# Patient Record
Sex: Female | Born: 1960 | Race: White | Hispanic: No | Marital: Married | Smoking: Former smoker
Health system: Southern US, Community
[De-identification: ages and names within clinical notes are randomized; demographics above are authoritative.]

## PROBLEM LIST (undated history)

## (undated) DIAGNOSIS — C50919 Malignant neoplasm of unspecified site of unspecified female breast: Secondary | ICD-10-CM

## (undated) DIAGNOSIS — N951 Menopausal and female climacteric states: Secondary | ICD-10-CM

## (undated) DIAGNOSIS — E079 Disorder of thyroid, unspecified: Secondary | ICD-10-CM

## (undated) HISTORY — PX: BREAST LUMPECTOMY: SHX2

## (undated) HISTORY — PX: TMJ ARTHROPLASTY: SHX1066

## (undated) HISTORY — PX: ENDOMETRIAL ABLATION: SHX621

## (undated) HISTORY — PX: TONSILLECTOMY: SUR1361

---

## 1997-06-30 ENCOUNTER — Ambulatory Visit (HOSPITAL_COMMUNITY): Admission: RE | Admit: 1997-06-30 | Discharge: 1997-06-30 | Payer: Self-pay | Admitting: *Deleted

## 1997-07-06 ENCOUNTER — Inpatient Hospital Stay (HOSPITAL_COMMUNITY): Admission: AD | Admit: 1997-07-06 | Discharge: 1997-07-06 | Payer: Self-pay | Admitting: Obstetrics and Gynecology

## 1997-10-24 ENCOUNTER — Inpatient Hospital Stay (HOSPITAL_COMMUNITY): Admission: AD | Admit: 1997-10-24 | Discharge: 1997-10-26 | Payer: Self-pay | Admitting: *Deleted

## 1999-08-19 ENCOUNTER — Encounter: Admission: RE | Admit: 1999-08-19 | Discharge: 1999-08-19 | Payer: Self-pay | Admitting: Family Medicine

## 1999-08-19 ENCOUNTER — Encounter: Payer: Self-pay | Admitting: Family Medicine

## 2000-07-26 ENCOUNTER — Encounter: Payer: Self-pay | Admitting: Family Medicine

## 2000-07-26 ENCOUNTER — Encounter: Admission: RE | Admit: 2000-07-26 | Discharge: 2000-07-26 | Payer: Self-pay | Admitting: Family Medicine

## 2000-07-31 ENCOUNTER — Encounter: Payer: Self-pay | Admitting: Family Medicine

## 2000-07-31 ENCOUNTER — Ambulatory Visit (HOSPITAL_COMMUNITY): Admission: RE | Admit: 2000-07-31 | Discharge: 2000-07-31 | Payer: Self-pay | Admitting: Family Medicine

## 2001-12-28 ENCOUNTER — Other Ambulatory Visit: Admission: RE | Admit: 2001-12-28 | Discharge: 2001-12-28 | Payer: Self-pay | Admitting: *Deleted

## 2003-03-20 ENCOUNTER — Other Ambulatory Visit: Admission: RE | Admit: 2003-03-20 | Discharge: 2003-03-20 | Payer: Self-pay | Admitting: *Deleted

## 2004-03-25 ENCOUNTER — Other Ambulatory Visit: Admission: RE | Admit: 2004-03-25 | Discharge: 2004-03-25 | Payer: Self-pay | Admitting: Obstetrics and Gynecology

## 2004-05-20 ENCOUNTER — Ambulatory Visit (HOSPITAL_COMMUNITY): Admission: RE | Admit: 2004-05-20 | Discharge: 2004-05-20 | Payer: Self-pay | Admitting: Obstetrics and Gynecology

## 2004-07-08 ENCOUNTER — Other Ambulatory Visit: Admission: RE | Admit: 2004-07-08 | Discharge: 2004-07-08 | Payer: Self-pay | Admitting: Obstetrics and Gynecology

## 2005-04-27 ENCOUNTER — Other Ambulatory Visit: Admission: RE | Admit: 2005-04-27 | Discharge: 2005-04-27 | Payer: Self-pay | Admitting: Obstetrics and Gynecology

## 2010-09-07 ENCOUNTER — Other Ambulatory Visit: Payer: Self-pay | Admitting: Obstetrics and Gynecology

## 2010-09-07 DIAGNOSIS — R928 Other abnormal and inconclusive findings on diagnostic imaging of breast: Secondary | ICD-10-CM

## 2010-09-24 ENCOUNTER — Ambulatory Visit
Admission: RE | Admit: 2010-09-24 | Discharge: 2010-09-24 | Disposition: A | Discharge status: Home / Self Care | Payer: 59 | Source: Ambulatory Visit | Attending: Obstetrics and Gynecology | Admitting: Obstetrics and Gynecology

## 2010-09-24 ENCOUNTER — Other Ambulatory Visit: Payer: Self-pay | Admitting: Obstetrics and Gynecology

## 2010-09-24 DIAGNOSIS — R928 Other abnormal and inconclusive findings on diagnostic imaging of breast: Secondary | ICD-10-CM

## 2010-09-24 DIAGNOSIS — R921 Mammographic calcification found on diagnostic imaging of breast: Secondary | ICD-10-CM

## 2010-09-27 ENCOUNTER — Other Ambulatory Visit: Payer: Self-pay | Admitting: Obstetrics and Gynecology

## 2010-09-27 ENCOUNTER — Ambulatory Visit
Admission: RE | Admit: 2010-09-27 | Discharge: 2010-09-27 | Disposition: A | Discharge status: Home / Self Care | Payer: 59 | Source: Ambulatory Visit | Attending: Obstetrics and Gynecology | Admitting: Obstetrics and Gynecology

## 2010-09-27 DIAGNOSIS — C50912 Malignant neoplasm of unspecified site of left female breast: Secondary | ICD-10-CM

## 2010-09-27 DIAGNOSIS — R921 Mammographic calcification found on diagnostic imaging of breast: Secondary | ICD-10-CM

## 2010-09-30 ENCOUNTER — Other Ambulatory Visit: Payer: 59

## 2017-10-12 ENCOUNTER — Other Ambulatory Visit: Payer: Self-pay

## 2017-10-12 ENCOUNTER — Ambulatory Visit
Admission: EM | Admit: 2017-10-12 | Discharge: 2017-10-12 | Disposition: A | Discharge status: Home / Self Care | Payer: 59 | Attending: Family Medicine | Admitting: Family Medicine

## 2017-10-12 DIAGNOSIS — R300 Dysuria: Secondary | ICD-10-CM

## 2017-10-12 HISTORY — DX: Malignant neoplasm of unspecified site of unspecified female breast: C50.919

## 2017-10-12 LAB — URINALYSIS, COMPLETE (UACMP) WITH MICROSCOPIC
BACTERIA UA: NONE SEEN
Bilirubin Urine: NEGATIVE
GLUCOSE, UA: NEGATIVE mg/dL
Ketones, ur: NEGATIVE mg/dL
Nitrite: NEGATIVE
Protein, ur: NEGATIVE mg/dL
SPECIFIC GRAVITY, URINE: 1.01 (ref 1.005–1.030)
Squamous Epithelial / LPF: NONE SEEN (ref 0–5)
pH: 7 (ref 5.0–8.0)

## 2017-10-12 MED ORDER — SULFAMETHOXAZOLE-TRIMETHOPRIM 800-160 MG PO TABS: 1.0000 | ORAL_TABLET | Freq: Two times a day (BID) | ORAL | 0 refills | Status: AC

## 2017-10-12 NOTE — ED Provider Notes (Signed)
[facility] ____________________________________________  Time seen: Approximately 12:07 PM  I have reviewed the triage vital signs and the nursing notes.   HISTORY  Chief Complaint Urinary Frequency  HPI Nichole Tucker is a 57 y.o. female presenting for evaluation of urinary frequency, urinary urgency and some burning with urination since yesterday.  States occasional suprapubic pressure sensation.  States has had one UTI in the past with same presentation.  Denies any vaginal discomfort, discharge, or complaints. Denies abdominal pain, back pain, fevers, vomiting or diarrhea.  No known trigger.  Reports otherwise feels well.  Denies aggravating or alleviating factors.  Continues to eat and drink well.  Denies other complaints.Denies recent sickness. Denies recent antibiotic use.  Denies renal insufficiency.   Past Medical History:  Diagnosis Date  . Breast cancer ([place])    remission    There are no active problems to display for this patient.   Past Surgical History:  Procedure Laterality Date  . BREAST LUMPECTOMY    . ENDOMETRIAL ABLATION    . TMJ ARTHROPLASTY    . TONSILLECTOMY       No current facility-administered medications for this encounter.   Current Outpatient Medications:  .  citalopram (CELEXA) 20 MG tablet, Take 20 mg by mouth daily., Disp: , Rfl:  .  levothyroxine (SYNTHROID, LEVOTHROID) 50 MCG tablet, , Disp: , Rfl:  .  rosuvastatin (CRESTOR) 10 MG tablet, TAKE 1 TABLET DAILY, Disp: , Rfl:  .  sulfamethoxazole-trimethoprim (BACTRIM DS,SEPTRA DS) 800-160 MG tablet, Take 1 tablet by mouth 2 (two) times daily for 3 days., Disp: 6 tablet, Rfl: 0  Allergies Patient has no known allergies.  Family History  Problem Relation Age of Onset  . Hypertension Mother   . Diabetes Father   . CVA Father   . Heart disease Father     Social History Social History   Tobacco Use  . Smoking status: Never Smoker  . Smokeless tobacco: Never Used    Substance Use Topics  . Alcohol use: Yes    Comment: occasionally  . Drug use: Not Currently    Review of Systems Constitutional: No fever/chills Cardiovascular: Denies chest pain. Respiratory: Denies shortness of breath. Gastrointestinal: No nausea, no vomiting.  No diarrhea.  No constipation. Genitourinary: positive for dysuria. Musculoskeletal: Negative for back pain. Skin: Negative for rash.   ____________________________________________   PHYSICAL EXAM:  VITAL SIGNS: ED Triage Vitals  Enc Vitals Group     BP 10/12/17 1147 (!) 115/95     Pulse Rate 10/12/17 1147 60     Resp 10/12/17 1147 18     Temp 10/12/17 1147 98.6 F (37 C)     Temp Source 10/12/17 1147 Oral     SpO2 10/12/17 1147 100 %     Weight 10/12/17 1144 128 lb (58.1 kg)     Height 10/12/17 1144 5\' 6"  (1.676 m)     Head Circumference --      Peak Flow --      Pain Score 10/12/17 1144 2     Pain Loc --      Pain Edu? --      Excl. in [place]? --     Constitutional: Alert and oriented. Well appearing and in no acute distress. ENT      Head: Normocephalic and atraumatic. Cardiovascular: Normal rate, regular rhythm. Grossly normal heart sounds.  Good peripheral circulation. Respiratory: Normal respiratory effort without tachypnea nor retractions. Breath sounds are clear and equal bilaterally. No wheezes, rales, rhonchi.  Gastrointestinal: Minimal midline suprapubic discomfort, abdomen otherwise soft and nontender. No CVA tenderness. Musculoskeletal: No midline cervical, thoracic or lumbar tenderness to palpation.  Neurologic:  Normal speech and language. Speech is normal. No gait instability.  Skin:  Skin is warm, dry and intact. No rash noted. Psychiatric: Mood and affect are normal. Speech and behavior are normal. Patient exhibits appropriate insight and judgment   ___________________________________________   LABS (all labs ordered are listed, but only abnormal results are displayed)  Labs Reviewed   URINALYSIS, COMPLETE (UACMP) WITH MICROSCOPIC - Abnormal; Notable for the following components:      Result Value   Color, Urine STRAW (*)    Hgb urine dipstick SMALL (*)    Leukocytes, UA MODERATE (*)    All other components within normal limits  URINE CULTURE     PROCEDURES Procedures    INITIAL IMPRESSION / ASSESSMENT AND PLAN / ED COURSE  Pertinent labs & imaging results that were available during my care of the patient were reviewed by me and considered in my medical decision making (see chart for details).  Well-appearing patient.  Presenting for evaluation of dysuria.  Discussed with patient urine not clear UTI, will culture.  In absence of other symptoms, will empirically treat with oral Bactrim and await urine culture.  Encourage rest, fluids, supportive care.Discussed indication, risks and benefits of medications with patient.  Discussed follow up with Primary care physician this week. Discussed follow up and return parameters including no resolution or any worsening concerns. Patient verbalized understanding and agreed to plan.   ____________________________________________   FINAL CLINICAL IMPRESSION(S) / ED DIAGNOSES  Final diagnoses:  Dysuria     ED Discharge Orders         Ordered    sulfamethoxazole-trimethoprim (BACTRIM DS,SEPTRA DS) 800-160 MG tablet  2 times daily     10/12/17 1203           Note: This dictation was prepared with Dragon dictation along with smaller phrase technology. Any transcriptional errors that result from this process are unintentional.         Marylene Land, NP 10/12/17 1213

## 2017-10-12 NOTE — ED Triage Notes (Signed)
Patient complains of urinary urgency, frequency and pain x yesterday.

## 2017-10-12 NOTE — Discharge Instructions (Addendum)
Take medication as prescribed. Rest. Drink plenty of fluids.  ° °Follow up with your primary care physician this week as needed. Return to Urgent care for new or worsening concerns.  ° °

## 2017-10-15 LAB — URINE CULTURE

## 2017-10-16 ENCOUNTER — Telehealth (HOSPITAL_COMMUNITY): Payer: Self-pay

## 2017-10-16 NOTE — Telephone Encounter (Signed)
Urine culture positive for E.coli. This was treated with bactrim at ucc visit. Attempted to reach patient. No answer at this time.

## 2017-12-21 ENCOUNTER — Encounter: Payer: Self-pay | Admitting: Emergency Medicine

## 2017-12-21 ENCOUNTER — Ambulatory Visit
Admission: EM | Admit: 2017-12-21 | Discharge: 2017-12-21 | Disposition: A | Discharge status: Home / Self Care | Payer: 59 | Attending: Family Medicine | Admitting: Family Medicine

## 2017-12-21 ENCOUNTER — Other Ambulatory Visit: Payer: Self-pay

## 2017-12-21 ENCOUNTER — Ambulatory Visit (INDEPENDENT_AMBULATORY_CARE_PROVIDER_SITE_OTHER): Payer: 59

## 2017-12-21 DIAGNOSIS — J302 Other seasonal allergic rhinitis: Secondary | ICD-10-CM | POA: Diagnosis not present

## 2017-12-21 DIAGNOSIS — M7551 Bursitis of right shoulder: Secondary | ICD-10-CM | POA: Diagnosis not present

## 2017-12-21 HISTORY — DX: Disorder of thyroid, unspecified: E07.9

## 2017-12-21 HISTORY — DX: Menopausal and female climacteric states: N95.1

## 2017-12-21 MED ORDER — NAPROXEN 500 MG PO TABS: 500.0000 mg | ORAL_TABLET | Freq: Two times a day (BID) | ORAL | 0 refills | Status: DC

## 2017-12-21 NOTE — Discharge Instructions (Addendum)
Apply ice 20 minutes out of every 2 hours 4-5 times daily for comfort.  Perform these pendulum exercises that I demonstrated to you 3 times daily for 1 to 2 minutes each time.  Use the sling for comfort.  Follow-up with orthopedic surgery if not improving in 2 to 3 weeks.  Sure to take your medications with food.  Use Flonase daily as well as Zyrtec Allegra or Claritin during the fall season.  You may need to repeat this in the springtime.

## 2017-12-21 NOTE — ED Provider Notes (Signed)
[facility]    CSN: [id number] Arrival date & time: 12/21/17  1031     History   Chief Complaint Chief Complaint  Patient presents with  . Sinus Problem  . Shoulder Pain    right    HPI Nichole Tucker is a 57 y.o. female.   HPI  57 year old female presents with 2 separate problems.  Problem  #1 is that of sinus pain and pressure productive cough that she is had for 1 month.  No fever or chills.  Says she is recently returned to the area after living in [place] for several years.  She feels that she is becoming acclimated to the allergens in the area.  The pressure is mostly over her eyes and also of the maxillary sinuses.  Been trying over-the-counter DayQuil and NyQuil but without relief.  Problems #2 is  right shoulder pain that she is had for 4 months.  He thinks that she initially injured it when she moved boxes return to our area from [place].  Today she was out using a low burs trimming bushes and last night was having severe pain in her shoulder.  She indicates the subacromial area.  Initiation of abduction extremely painful.  Her only external rotation is also very painful.  He does not have any neck pain or any numbness or tingling into her fingers.         Past Medical History:  Diagnosis Date  . Breast cancer ([place])    remission  . Menopausal sweats   . Thyroid disease     There are no active problems to display for this patient.   Past Surgical History:  Procedure Laterality Date  . BREAST LUMPECTOMY    . ENDOMETRIAL ABLATION    . TMJ ARTHROPLASTY    . TONSILLECTOMY      OB History   None      Home Medications    Prior to Admission medications   Medication Sig Start Date End Date Taking? Authorizing Provider  citalopram (CELEXA) 20 MG tablet Take 20 mg by mouth daily.   Yes [provider]  levothyroxine (SYNTHROID, LEVOTHROID) 50 MCG tablet  02/27/11  Yes [provider]  rosuvastatin (CRESTOR) 10 MG tablet TAKE  1 TABLET DAILY 10/05/10  Yes [provider]  naproxen (NAPROSYN) 500 MG tablet Take 1 tablet (500 mg total) by mouth 2 (two) times daily with a meal. 12/21/17   Lorin Picket, PA-C    Family History Family History  Problem Relation Age of Onset  . Hypertension Mother   . Diabetes Father   . CVA Father   . Heart disease Father     Social History Social History   Tobacco Use  . Smoking status: Former Smoker    Last attempt to quit: 12/21/2016    Years since quitting: 1.0  . Smokeless tobacco: Never Used  Substance Use Topics  . Alcohol use: Yes    Comment: occasionally  . Drug use: Never     Allergies   Patient has no known allergies.   Review of Systems Review of Systems   Physical Exam Triage Vital Signs ED Triage Vitals  Enc Vitals Group     BP 12/21/17 1047 101/85     Pulse Rate 12/21/17 1047 89     Resp 12/21/17 1047 16     Temp 12/21/17 1047 99.1 F (37.3 C)     Temp Source 12/21/17 1047 Oral     SpO2 12/21/17 1047  99 %     Weight 12/21/17 1047 130 lb (59 kg)     Height 12/21/17 1047 5\' 6"  (1.676 m)     Head Circumference --      Peak Flow --      Pain Score 12/21/17 1046 8     Pain Loc --      Pain Edu? --      Excl. in [place]? --    No data found.  Updated Vital Signs BP 101/85 (BP Location: Left Arm)   Pulse 89   Temp 99.1 F (37.3 C) (Oral)   Resp 16   Ht 5\' 6"  (1.676 m)   Wt 130 lb (59 kg)   SpO2 99%   BMI 20.98 kg/m   Visual Acuity Right Eye Distance:   Left Eye Distance:   Bilateral Distance:    Right Eye Near:   Left Eye Near:    Bilateral Near:     Physical Exam   UC Treatments / Results  Labs (all labs ordered are listed, but only abnormal results are displayed) Labs Reviewed - No data to display  EKG None  Radiology Dg Shoulder Right  Result Date: 12/21/2017 CLINICAL DATA:  Injured moving boxes in July this year, now with limited range of motion and some pain EXAM: RIGHT SHOULDER - 2+ VIEW  COMPARISON:  None. FINDINGS: The right humeral head is in normal position and the glenohumeral joint space appears normal. No significant degenerative change is seen. The right [facility] joint is normally aligned. The ribs that are visualized are intact. IMPRESSION: Negative. Electronically Signed   By: Ivar Drape M.D.   On: 12/21/2017 11:36    Procedures Procedures (including critical care time)  Medications Ordered in UC Medications - No data to display  Initial Impression / Assessment and Plan / UC Course  I have reviewed the triage vital signs and the nursing notes.  Pertinent labs & imaging results that were available during my care of the patient were reviewed by me and considered in my medical decision making (see chart for details).    Apply ice 20 minutes out of every 2 hours 4-5 times daily for comfort.  Perform these pendulum exercises that I demonstrated to you 3 times daily for 1 to 2 minutes each time.  Use the sling for comfort.  Follow-up with orthopedic surgery if not improving in 2 to 3 weeks.  Sure to take your medications with food.   Final Clinical Impressions(s) / UC Diagnoses   Final diagnoses:  Subacromial bursitis of right shoulder joint  Seasonal allergies     Discharge Instructions     Apply ice 20 minutes out of every 2 hours 4-5 times daily for comfort.  Perform these pendulum exercises that I demonstrated to you 3 times daily for 1 to 2 minutes each time.  Use the sling for comfort.  Follow-up with orthopedic surgery if not improving in 2 to 3 weeks.  Sure to take your medications with food.    ED Prescriptions    Medication Sig Dispense Auth. Provider   naproxen (NAPROSYN) 500 MG tablet Take 1 tablet (500 mg total) by mouth 2 (two) times daily with a meal. 60 tablet Lorin Picket, PA-C     Controlled Substance Prescriptions [place] Controlled Substance Registry consulted? Not Applicable   Lorin Picket, PA-C 12/21/17 1211

## 2017-12-21 NOTE — ED Triage Notes (Signed)
Patient in today c/o sinus pressure/pain, productive cough x 1 month. Patient denies fever. Patient has tried OTC Dayquil, Nyquil without relief.  Patient also c/o right shoulder pain x 4 months, but getting worse. Patient relates it back to packing and moving boxes.

## 2018-02-28 ENCOUNTER — Ambulatory Visit
Admission: EM | Admit: 2018-02-28 | Discharge: 2018-02-28 | Disposition: A | Discharge status: Home / Self Care | Payer: 59 | Attending: Family Medicine | Admitting: Family Medicine

## 2018-02-28 ENCOUNTER — Other Ambulatory Visit: Payer: Self-pay

## 2018-02-28 DIAGNOSIS — R0982 Postnasal drip: Secondary | ICD-10-CM

## 2018-02-28 DIAGNOSIS — J029 Acute pharyngitis, unspecified: Secondary | ICD-10-CM

## 2018-02-28 DIAGNOSIS — Z87891 Personal history of nicotine dependence: Secondary | ICD-10-CM | POA: Diagnosis not present

## 2018-02-28 LAB — RAPID STREP SCREEN (MED CTR MEBANE ONLY): STREPTOCOCCUS, GROUP A SCREEN (DIRECT): NEGATIVE

## 2018-02-28 NOTE — ED Triage Notes (Signed)
Patient complains of sore throat x dec 28. Patient states that she has also felt a lump in her throat that has not seemed to improve. States that she has painful swallowing. Has tried flonase without much relief.

## 2018-02-28 NOTE — Discharge Instructions (Signed)
Flonase, claritin, salt water gargles, increase water intake

## 2018-02-28 NOTE — ED Provider Notes (Signed)
[facility]    CSN: [id number] Arrival date & time: 02/28/18  1209     History   Chief Complaint Chief Complaint  Patient presents with  . Sore Throat    HPI Nichole Tucker is a 58 y.o. female.   The history is provided by the patient.  URI  Presenting symptoms: congestion, rhinorrhea and sore throat   Severity:  Moderate Onset quality:  Sudden Duration:  2 weeks Timing:  Constant Progression:  Unchanged Chronicity:  New Relieved by:  Nothing Ineffective treatments:  OTC medications Associated symptoms: no sinus pain and no wheezing   Risk factors: sick contacts     Past Medical History:  Diagnosis Date  . Breast cancer ([place])    remission  . Menopausal sweats   . Thyroid disease     There are no active problems to display for this patient.   Past Surgical History:  Procedure Laterality Date  . BREAST LUMPECTOMY    . ENDOMETRIAL ABLATION    . TMJ ARTHROPLASTY    . TONSILLECTOMY      OB History   No obstetric history on file.      Home Medications    Prior to Admission medications   Medication Sig Start Date End Date Taking? Authorizing Provider  citalopram (CELEXA) 20 MG tablet Take 20 mg by mouth daily.   Yes [provider]  fluticasone (FLONASE) 50 MCG/ACT nasal spray Place into both nostrils daily.   Yes [provider]  levothyroxine (SYNTHROID, LEVOTHROID) 50 MCG tablet  02/27/11  Yes [provider]  rosuvastatin (CRESTOR) 10 MG tablet TAKE 1 TABLET DAILY 10/05/10  Yes [provider]  naproxen (NAPROSYN) 500 MG tablet Take 1 tablet (500 mg total) by mouth 2 (two) times daily with a meal. 12/21/17   Lorin Picket, PA-C    Family History Family History  Problem Relation Age of Onset  . Hypertension Mother   . Diabetes Father   . CVA Father   . Heart disease Father     Social History Social History   Tobacco Use  . Smoking status: Former Smoker    Last attempt to quit: 12/21/2016   Years since quitting: 1.1  . Smokeless tobacco: Never Used  Substance Use Topics  . Alcohol use: Yes    Comment: occasionally  . Drug use: Never     Allergies   Patient has no known allergies.   Review of Systems Review of Systems  HENT: Positive for congestion, rhinorrhea and sore throat. Negative for sinus pain.   Respiratory: Negative for wheezing.      Physical Exam Triage Vital Signs ED Triage Vitals  Enc Vitals Group     BP 02/28/18 1222 (!) 123/94     Pulse Rate 02/28/18 1222 62     Resp 02/28/18 1222 18     Temp 02/28/18 1222 98.3 F (36.8 C)     Temp Source 02/28/18 1222 Oral     SpO2 02/28/18 1222 100 %     Weight 02/28/18 1221 135 lb (61.2 kg)     Height 02/28/18 1221 5\' 6"  (1.676 m)     Head Circumference --      Peak Flow --      Pain Score 02/28/18 1221 2     Pain Loc --      Pain Edu? --      Excl. in [place]? --    No data found.  Updated Vital Signs BP (!) 123/94 (  BP Location: Right Arm)   Pulse 62   Temp 98.3 F (36.8 C) (Oral)   Resp 18   Ht 5\' 6"  (1.676 m)   Wt 61.2 kg   SpO2 100%   BMI 21.79 kg/m   Visual Acuity Right Eye Distance:   Left Eye Distance:   Bilateral Distance:    Right Eye Near:   Left Eye Near:    Bilateral Near:     Physical Exam Vitals signs and nursing note reviewed.  Constitutional:      General: She is not in acute distress.    Appearance: She is well-developed. She is not toxic-appearing or diaphoretic.  HENT:     Head: Normocephalic and atraumatic.     Right Ear: Tympanic membrane, ear canal and external ear normal.     Left Ear: Tympanic membrane, ear canal and external ear normal.     Mouth/Throat:     Pharynx: Uvula midline. Posterior oropharyngeal erythema present. No oropharyngeal exudate.  Neck:     Musculoskeletal: Normal range of motion and neck supple.     Thyroid: No thyromegaly.  Cardiovascular:     Rate and Rhythm: Normal rate and regular rhythm.     Heart sounds: Normal heart sounds.    Pulmonary:     Effort: Pulmonary effort is normal. No respiratory distress.     Breath sounds: Normal breath sounds. No stridor. No wheezing, rhonchi or rales.  Lymphadenopathy:     Cervical: No cervical adenopathy.  Neurological:     Mental Status: She is alert.      UC Treatments / Results  Labs (all labs ordered are listed, but only abnormal results are displayed) Labs Reviewed  RAPID STREP SCREEN ([facility] ONLY)  CULTURE, GROUP A STREP [facility])    EKG None  Radiology No results found.  Procedures Procedures (including critical care time)  Medications Ordered in UC Medications - No data to display  Initial Impression / Assessment and Plan / UC Course  I have reviewed the triage vital signs and the nursing notes.  Pertinent labs & imaging results that were available during my care of the patient were reviewed by me and considered in my medical decision making (see chart for details).      Final Clinical Impressions(s) / UC Diagnoses   Final diagnoses:  Sore throat  Post-nasal drainage     Discharge Instructions     Flonase, claritin, salt water gargles, increase water intake    ED Prescriptions    None     1. Lab results and diagnosis reviewed with patient 2. Recommend supportive treatment as above 3. Follow-up prn if symptoms worsen or don't improve   Controlled Substance Prescriptions [place] Controlled Substance Registry consulted? Not Applicable   Norval Gable, MD 02/28/18 1426

## 2018-03-03 LAB — CULTURE, GROUP A STREP (THRC)

## 2019-11-24 IMAGING — CR DG SHOULDER 2+V*R*
3 series · 3 of 3 positions shown · non-contrast
Comparison: None.

CLINICAL DATA: Injured moving boxes in [REDACTED] this year, now with
limited range of motion and some pain

EXAM:
RIGHT SHOULDER - 2+ VIEW

[shoulder grashey]
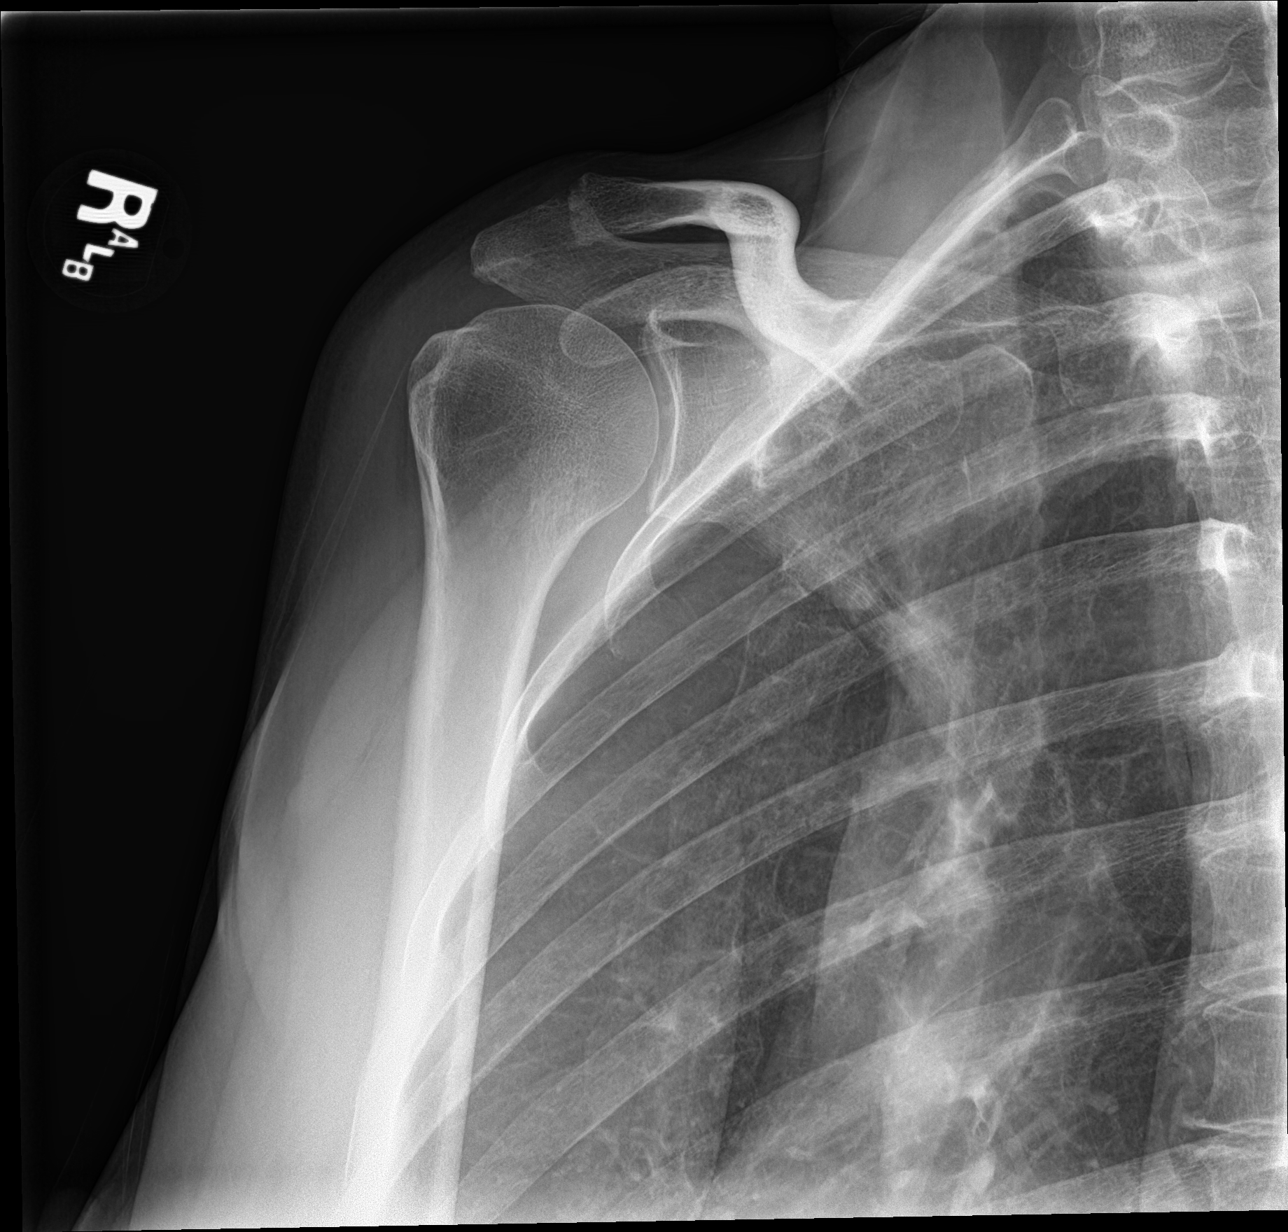

[shoulder y view]
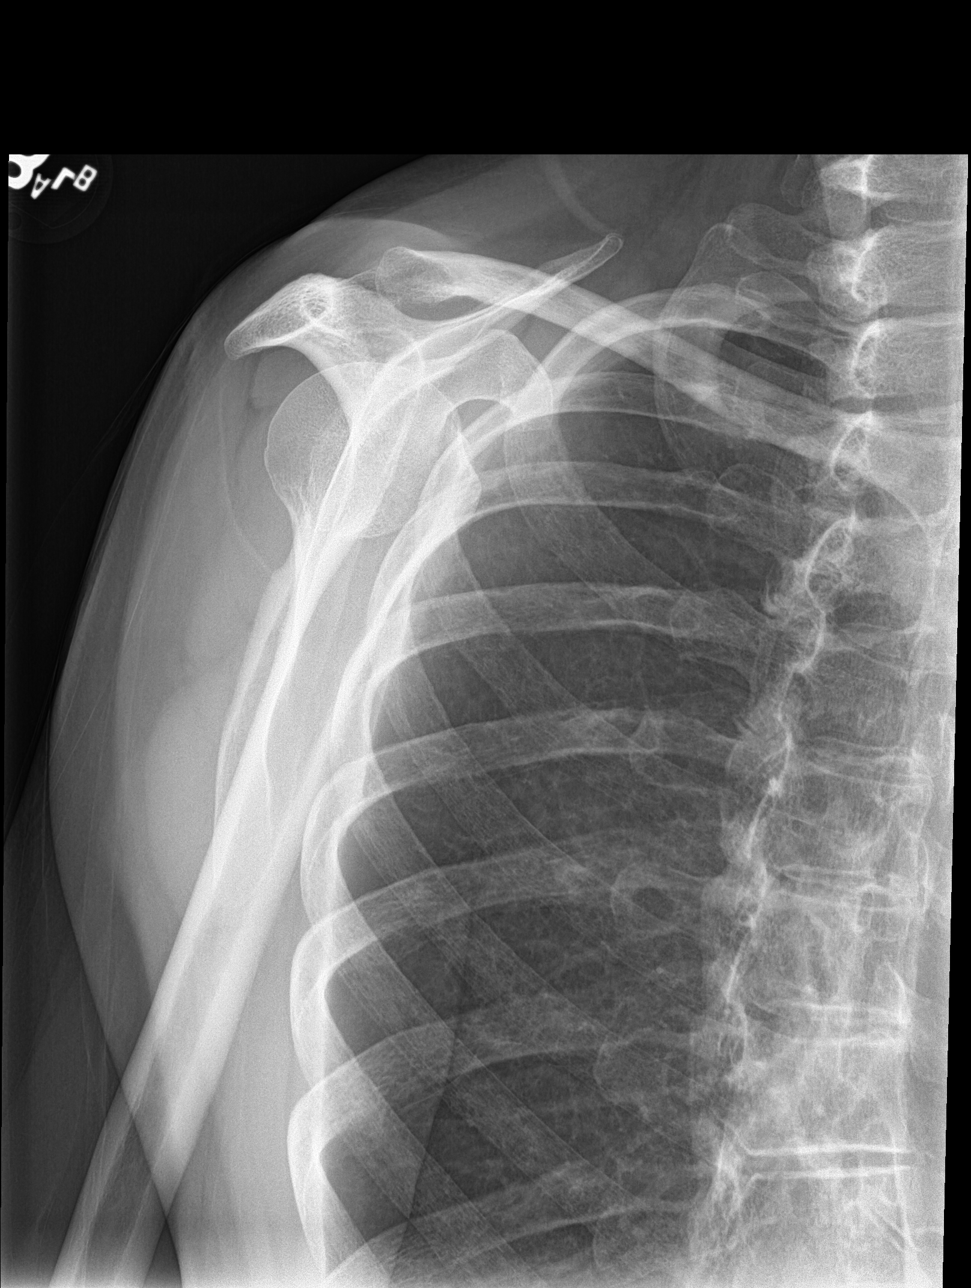

[shoulder axial]
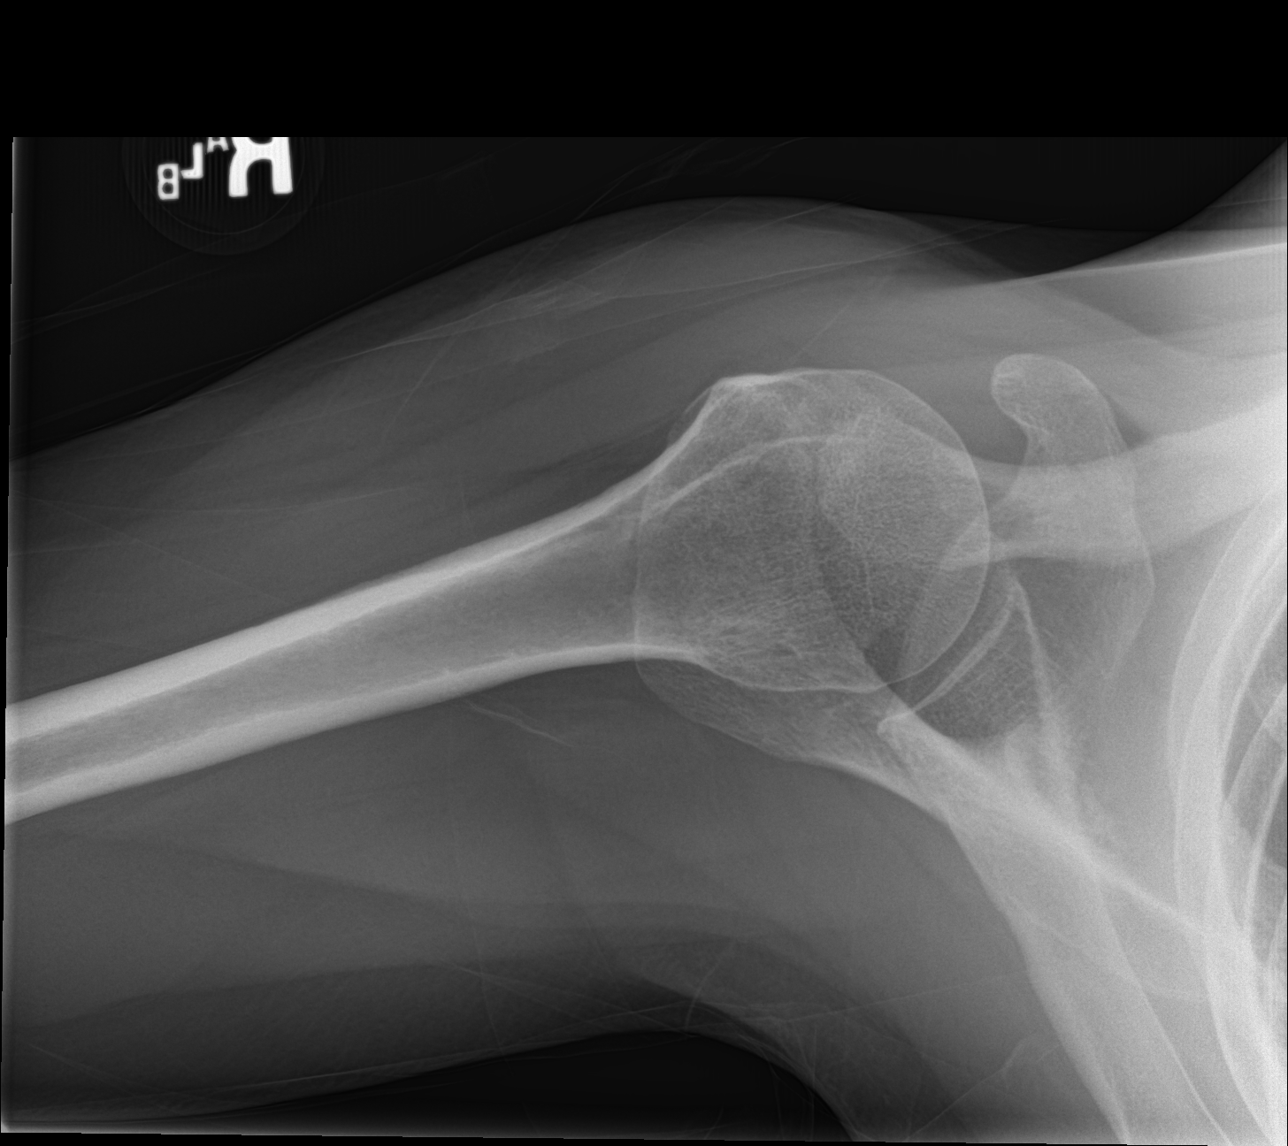

[3 of 3 positions shown; findings below may reference images not displayed]

FINDINGS: The right humeral head is in normal position and the glenohumeral
joint space appears normal. No significant degenerative change is
seen. The right AC joint is normally aligned. The ribs that are
visualized are intact.
IMPRESSION: Negative.

## 2020-02-21 ENCOUNTER — Other Ambulatory Visit: Payer: Self-pay

## 2020-02-21 ENCOUNTER — Ambulatory Visit (INDEPENDENT_AMBULATORY_CARE_PROVIDER_SITE_OTHER): Payer: 59

## 2020-02-21 ENCOUNTER — Ambulatory Visit
Admission: EM | Admit: 2020-02-21 | Discharge: 2020-02-21 | Disposition: A | Discharge status: Home / Self Care | Payer: 59 | Attending: Family Medicine | Admitting: Family Medicine

## 2020-02-21 DIAGNOSIS — S60211A Contusion of right wrist, initial encounter: Secondary | ICD-10-CM

## 2020-02-21 DIAGNOSIS — M25531 Pain in right wrist: Secondary | ICD-10-CM

## 2020-02-21 DIAGNOSIS — S52571A Other intraarticular fracture of lower end of right radius, initial encounter for closed fracture: Secondary | ICD-10-CM | POA: Diagnosis not present

## 2020-02-21 MED ORDER — HYDROCODONE-ACETAMINOPHEN 5-325 MG PO TABS: 1.0000 | ORAL_TABLET | Freq: Three times a day (TID) | ORAL | 0 refills | Status: DC | PRN

## 2020-02-21 NOTE — Discharge Instructions (Addendum)
Medication as prescribed.  Please call [facility] ([phone]) OR [facility] ([phone]) for an appt.  Take care  Dr. Zniyah Midkiff  

## 2020-02-21 NOTE — ED Provider Notes (Signed)
[facility]    CSN: [id number] Arrival date & time: 02/21/20  1420      History   Chief Complaint Chief Complaint  Patient presents with  . Wrist Injury   HPI  59 year old female presents with the above complaint.  Patient states that she was outside.  She was trying to break a log by hitting it on a tree.  While trying to do so, the [place] did not break and she subsequently injured her right wrist.  She has swelling, bruising, and pain in the right wrist.  Pain 8/10 in severity.  Described as throbbing.  She is concerned that she has fractured her wrist.  No relieving factors.  No other associated symptoms.  No other complaints.  Past Medical History:  Diagnosis Date  . Breast cancer (HCC)    remission  . Menopausal sweats   . Thyroid disease    Past Surgical History:  Procedure Laterality Date  . BREAST LUMPECTOMY    . ENDOMETRIAL ABLATION    . TMJ ARTHROPLASTY    . TONSILLECTOMY      OB History   No obstetric history on file.      Home Medications    Prior to Admission medications   Medication Sig Start Date End Date Taking? Authorizing Provider  citalopram (CELEXA) 20 MG tablet Take 20 mg by mouth daily.   Yes [provider]  fluticasone (FLONASE) 50 MCG/ACT nasal spray Place into both nostrils daily.   Yes [provider]  HYDROcodone-acetaminophen (NORCO/VICODIN) 5-325 MG tablet Take 1 tablet by mouth every 8 (eight) hours as needed for moderate pain. 02/21/20  Yes Tommie Sams, DO  levothyroxine (SYNTHROID, LEVOTHROID) 50 MCG tablet  02/27/11  Yes [provider]  rosuvastatin (CRESTOR) 10 MG tablet TAKE 1 TABLET DAILY 10/05/10  Yes [provider]    Family History Family History  Problem Relation Age of Onset  . Hypertension Mother   . Diabetes Father   . CVA Father   . Heart disease Father     Social History Social History   Tobacco Use  . Smoking status: Former Smoker    Quit date: 12/21/2016     Years since quitting: 3.1  . Smokeless tobacco: Never Used  Vaping Use  . Vaping Use: Never used  Substance Use Topics  . Alcohol use: Yes    Comment: occasionally  . Drug use: Never     Allergies   Patient has no known allergies.   Review of Systems Review of Systems Per HPI  Physical Exam Triage Vital Signs ED Triage Vitals  Enc Vitals Group     BP 02/21/20 1650 (!) 125/99     Pulse Rate 02/21/20 1650 69     Resp 02/21/20 1650 18     Temp 02/21/20 1650 98.7 F (37.1 C)     Temp Source 02/21/20 1650 Oral     SpO2 02/21/20 1650 99 %     Weight 02/21/20 1647 142 lb (64.4 kg)     Height 02/21/20 1647 5\' 6"  (1.676 m)     Head Circumference --      Peak Flow --      Pain Score 02/21/20 1646 8     Pain Loc --      Pain Edu? --      Excl. in [place]? --    Updated Vital Signs BP (!) 125/99 (BP Location: Left Arm)   Pulse 69   Temp 98.7 F (37.1 C) (  Oral)   Resp 18   Ht 5\' 6"  (1.676 m)   Wt 64.4 kg   SpO2 99%   BMI 22.92 kg/m   Visual Acuity Right Eye Distance:   Left Eye Distance:   Bilateral Distance:    Right Eye Near:   Left Eye Near:    Bilateral Near:     Physical Exam Vitals and nursing note reviewed.  Constitutional:      General: She is not in acute distress.    Appearance: Normal appearance. She is not ill-appearing.  HENT:     Head: Normocephalic and atraumatic.  Eyes:     General:        Right eye: No discharge.        Left eye: No discharge.     Conjunctiva/sclera: Conjunctivae normal.  Musculoskeletal:     Comments: Right wrist -bruising noted on the volar aspect of the wrist and extends proximally.  Exquisite tenderness over the distal radius.  Neurological:     Mental Status: She is alert.  Psychiatric:        Mood and Affect: Mood normal.        Behavior: Behavior normal.    UC Treatments / Results  Labs (all labs ordered are listed, but only abnormal results are displayed) Labs Reviewed - No data to  display  EKG   Radiology DG Wrist Complete Right  Result Date: 02/21/2020 CLINICAL DATA:  Pain and bruising EXAM: RIGHT WRIST - COMPLETE 3+ VIEW COMPARISON:  None. FINDINGS: Acute nondisplaced fracture involving the styloid process of the radius, with probable intra-articular component. Positive for soft tissue swelling. No subluxation. IMPRESSION: Acute nondisplaced probable intra-articular fracture involving the radial styloid. Electronically Signed   By: 02/23/2020 M.D.   On: 02/21/2020 17:04    Procedures Procedures (including critical care time)  Medications Ordered in UC Medications - No data to display  Initial Impression / Assessment and Plan / UC Course  I have reviewed the triage vital signs and the nursing notes.  Pertinent labs & imaging results that were available during my care of the patient were reviewed by me and considered in my medical decision making (see chart for details).    59 year old female presents with a wrist fracture.  X-ray was obtained and was independently reviewed by me.  Interpretation: Nondisplaced fracture of the radial styloid.  Suspect intra-articular extension.  Placed in sugar tong splint.  Sling for comfort.  Hydrocodone as needed for pain.  Advised to see Ortho early next week.  Final Clinical Impressions(s) / UC Diagnoses   Final diagnoses:  Other closed intra-articular fracture of distal end of right radius, initial encounter     Discharge Instructions     Medication as prescribed.  Please call [facility] [phone]) OR [facility] [phone]) for an appt.  Take care  Dr. ([phone]     ED Prescriptions    Medication Sig Dispense Auth. Provider   HYDROcodone-acetaminophen (NORCO/VICODIN) 5-325 MG tablet Take 1 tablet by mouth every 8 (eight) hours as needed for moderate pain. 15 tablet Adriana Simas G, DO     I have reviewed the PDMP during this encounter.   Everlene Other, Tommie Sams 02/21/20 1918

## 2020-02-21 NOTE — ED Triage Notes (Signed)
Patient states that was trying to break a limb off a tree on another tree and the limb didn't break and came and hit her in the wrist. Patient with bruising and throbbing pain in right wrist.

## 2020-02-24 ENCOUNTER — Other Ambulatory Visit (INDEPENDENT_AMBULATORY_CARE_PROVIDER_SITE_OTHER): Payer: Self-pay | Admitting: Family Medicine

## 2020-09-09 ENCOUNTER — Ambulatory Visit
Admission: EM | Admit: 2020-09-09 | Discharge: 2020-09-09 | Disposition: A | Discharge status: Home / Self Care | Payer: 59 | Attending: Sports Medicine | Admitting: Sports Medicine

## 2020-09-09 ENCOUNTER — Other Ambulatory Visit: Payer: Self-pay

## 2020-09-09 DIAGNOSIS — R0989 Other specified symptoms and signs involving the circulatory and respiratory systems: Secondary | ICD-10-CM

## 2020-09-09 DIAGNOSIS — J209 Acute bronchitis, unspecified: Secondary | ICD-10-CM | POA: Diagnosis not present

## 2020-09-09 DIAGNOSIS — R059 Cough, unspecified: Secondary | ICD-10-CM

## 2020-09-09 DIAGNOSIS — J069 Acute upper respiratory infection, unspecified: Secondary | ICD-10-CM

## 2020-09-09 MED ORDER — ALBUTEROL SULFATE HFA 108 (90 BASE) MCG/ACT IN AERS: 1.0000 | INHALATION_SPRAY | Freq: Four times a day (QID) | RESPIRATORY_TRACT | 0 refills | Status: DC | PRN

## 2020-09-09 MED ORDER — AZITHROMYCIN 250 MG PO TABS: 250.0000 mg | ORAL_TABLET | Freq: Every day | ORAL | 0 refills | Status: DC

## 2020-09-09 NOTE — ED Provider Notes (Signed)
[facility]    CSN: [id number] Arrival date & time: 09/09/20  1139      History   Chief Complaint Chief Complaint  Patient presents with   Cough    HPI Nichole Tucker is a 60 y.o. female.   Patient is a 60 year old female who presents for evaluation of about 1 week of upper respiratory type complaints.  Normally is seen in [place] by [facility] for primary care needs.  She is retired and does not work outside the home.  She has had about 1 week now of cough, rhinorrhea, chest congestion.  She has been using Robitussin and Mucinex.  She says that she does get bronchitis and she waited a week before coming in to be seen as she was hoping it would get better.  She denies any fever shakes chills.  No nausea vomiting or diarrhea.  No myalgias.  No urinary or abdominal symptoms.  She has no history of asthma.  No history of COPD although she is a former smoker and she quit 4 years ago.  She denies any chest pain or shortness of breath.  No red flag signs or symptoms elicited on history.    Past Medical History:  Diagnosis Date   Breast cancer ([place])    remission   Menopausal sweats    Thyroid disease     There are no problems to display for this patient.   Past Surgical History:  Procedure Laterality Date   BREAST LUMPECTOMY     ENDOMETRIAL ABLATION     TMJ ARTHROPLASTY     TONSILLECTOMY      OB History   No obstetric history on file.      Home Medications    Prior to Admission medications   Medication Sig Start Date End Date Taking? Authorizing Provider  albuterol (VENTOLIN HFA) 108 (90 Base) MCG/ACT inhaler Inhale 1-2 puffs into the lungs every 6 (six) hours as needed for wheezing or shortness of breath. 09/09/20  Yes Verda Cumins, MD  azithromycin (ZITHROMAX) 250 MG tablet Take 1 tablet (250 mg total) by mouth daily. Take first 2 tablets together, then 1 every day until finished. 09/09/20  Yes Verda Cumins, MD  citalopram (CELEXA) 20 MG tablet Take 20  mg by mouth daily.   Yes [provider]  fluticasone (FLONASE) 50 MCG/ACT nasal spray Place into both nostrils daily.   Yes [provider]  levothyroxine (SYNTHROID, LEVOTHROID) 50 MCG tablet  02/27/11  Yes [provider]  rosuvastatin (CRESTOR) 10 MG tablet TAKE 1 TABLET DAILY 10/05/10  Yes [provider]  HYDROcodone-acetaminophen (NORCO/VICODIN) 5-325 MG tablet Take 1 tablet by mouth every 8 (eight) hours as needed for moderate pain. 02/21/20   Coral Spikes, DO    Family History Family History  Problem Relation Age of Onset   Hypertension Mother    Diabetes Father    CVA Father    Heart disease Father     Social History Social History   Tobacco Use   Smoking status: Former    Types: Cigarettes    Quit date: 12/21/2016    Years since quitting: 3.7   Smokeless tobacco: Never  Vaping Use   Vaping Use: Never used  Substance Use Topics   Alcohol use: Yes    Comment: occasionally   Drug use: Never     Allergies   Patient has no known allergies.   Review of Systems Review of Systems  Constitutional:  Negative for activity change,  appetite change, chills, diaphoresis, fatigue and fever.  HENT:  Positive for congestion. Negative for ear pain, postnasal drip, rhinorrhea, sinus pressure, sinus pain, sneezing and sore throat.   Eyes:  Negative for pain.  Respiratory:  Positive for cough and chest tightness. Negative for shortness of breath.   Cardiovascular:  Negative for chest pain and palpitations.  Gastrointestinal:  Negative for abdominal pain, diarrhea, nausea and vomiting.  Genitourinary:  Negative for dysuria.  Musculoskeletal:  Negative for back pain, myalgias and neck pain.  Skin:  Negative for color change, pallor, rash and wound.  Neurological:  Negative for dizziness, light-headedness and headaches.  All other systems reviewed and are negative.   Physical Exam Triage Vital Signs ED Triage Vitals [09/09/20 1154]  Enc  Vitals Group     BP 123/87     Pulse Rate 62     Resp 18     Temp 98.9 F (37.2 C)     Temp Source Oral     SpO2 96 %     Weight 142 lb (64.4 kg)     Height      Head Circumference      Peak Flow      Pain Score 0     Pain Loc      Pain Edu?      Excl. in [place]?    No data found.  Updated Vital Signs BP 123/87 (BP Location: Left Arm)   Pulse 62   Temp 98.9 F (37.2 C) (Oral)   Resp 18   Wt 64.4 kg   SpO2 96%   BMI 22.92 kg/m   Visual Acuity Right Eye Distance:   Left Eye Distance:   Bilateral Distance:    Right Eye Near:   Left Eye Near:    Bilateral Near:     Physical Exam Vitals and nursing note reviewed.  Constitutional:      General: She is not in acute distress.    Appearance: Normal appearance. She is not ill-appearing, toxic-appearing or diaphoretic.  HENT:     Head: Normocephalic and atraumatic.     Nose: Nose normal. No congestion or rhinorrhea.     Mouth/Throat:     Mouth: Mucous membranes are moist.     Pharynx: No oropharyngeal exudate or posterior oropharyngeal erythema.  Eyes:     General: No scleral icterus.       Right eye: No discharge.        Left eye: No discharge.     Extraocular Movements: Extraocular movements intact.     Conjunctiva/sclera: Conjunctivae normal.     Pupils: Pupils are equal, round, and reactive to light.  Cardiovascular:     Rate and Rhythm: Normal rate and regular rhythm.     Pulses: Normal pulses.     Heart sounds: Normal heart sounds. No murmur heard.   No friction rub. No gallop.  Pulmonary:     Effort: Pulmonary effort is normal.     Breath sounds: No stridor. No rhonchi or rales.     Comments: Scattered rales noted throughout all lung fields but most prominent in the right upper lung field. Musculoskeletal:     Cervical back: Normal range of motion and neck supple. No rigidity or tenderness.  Lymphadenopathy:     Cervical: Cervical adenopathy present.  Skin:    General: Skin is warm and dry.      Capillary Refill: Capillary refill takes less than 2 seconds.     Coloration: Skin is not jaundiced.  Findings: No erythema, lesion or rash.  Neurological:     General: No focal deficit present.     Mental Status: She is alert and oriented to person, place, and time.     UC Treatments / Results  Labs (all labs ordered are listed, but only abnormal results are displayed) Labs Reviewed - No data to display  EKG   Radiology No results found.  Procedures Procedures (including critical care time)  Medications Ordered in UC Medications - No data to display  Initial Impression / Assessment and Plan / UC Course  I have reviewed the triage vital signs and the nursing notes.  Pertinent labs & imaging results that were available during my care of the patient were reviewed by me and considered in my medical decision making (see chart for details).  Clinical impression: 1.  Acute bronchitis with scattered rales on auscultation 2.  Upper respiratory infection with cough and congestion.  Treatment plan: 1.  The findings and treatment plan were discussed in detail with the patient.  Patient was in agreement. 2.  I recommended going ahead and treating her with azithromycin.  It was sent to her pharmacy. 3.  Asked her to go ahead and continue with the Robitussin for the cough and the Mucinex for the congestion to help thin secretions. 4.  We discussed the cough medication but she wants to use the Robitussin which is fine. 5.  We also discussed oral steroids, but we will go ahead and hold on that as she does not have a diagnosis of COPD.  She may need this in the future if she does not improve with the antibiotic and the inhaler. 6.  Did prescribe her an albuterol inhaler to try to open up the lower bronchioles and help her cough the mucus up. 7.  If symptoms do not improve then I have asked her to see her PCP. 8.  If they are worse she should go to the ER. 9.  Educational handouts  provided. 10.  She was discharged in stable condition will follow-up here as needed.    Final Clinical Impressions(s) / UC Diagnoses   Final diagnoses:  Acute bronchitis, unspecified organism  Cough  Upper respiratory tract infection, unspecified type  Chest congestion     Discharge Instructions      As we discussed, I am treating for you for an acute bronchitis. Prescribed an antibiotic as well as an inhaler to help open up your lower bronchioles and cough up some of the mucus. He can continue with the Robitussin which can help you with sleep, but I only need to suppress the cough too much. Please continue with the Mucinex which will help thin secretions. We talked about putting you on oral steroids but will hold on that for now.  If you do not improve though that might be the next step.  Although you do not have a history of COPD, you do have a smoking history. Please see educational handouts. If your symptoms do not improve please see your primary care provider. If your symptoms worsen in any way then please go to the emergency room.     ED Prescriptions     Medication Sig Dispense Auth. Provider   albuterol (VENTOLIN HFA) 108 (90 Base) MCG/ACT inhaler Inhale 1-2 puffs into the lungs every 6 (six) hours as needed for wheezing or shortness of breath. 1 each Verda Cumins, MD   azithromycin (ZITHROMAX) 250 MG tablet Take 1 tablet (250 mg total) by mouth  daily. Take first 2 tablets together, then 1 every day until finished. 6 tablet Verda Cumins, MD      PDMP not reviewed this encounter.   Verda Cumins, MD 09/09/20 [phone]

## 2020-09-09 NOTE — ED Triage Notes (Signed)
Pt c/o cough and chest congestion for the last 7-8 days. No fever

## 2020-09-09 NOTE — Discharge Instructions (Addendum)
As we discussed, I am treating for you for an acute bronchitis. Prescribed an antibiotic as well as an inhaler to help open up your lower bronchioles and cough up some of the mucus. He can continue with the Robitussin which can help you with sleep, but I only need to suppress the cough too much. Please continue with the Mucinex which will help thin secretions. We talked about putting you on oral steroids but will hold on that for now.  If you do not improve though that might be the next step.  Although you do not have a history of COPD, you do have a smoking history. Please see educational handouts. If your symptoms do not improve please see your primary care provider. If your symptoms worsen in any way then please go to the emergency room.

## 2022-01-24 IMAGING — CR DG WRIST COMPLETE 3+V*R*
4 series · 4 of 4 positions shown · non-contrast
Comparison: None.

CLINICAL DATA: Pain and bruising

EXAM:
RIGHT WRIST - COMPLETE 3+ VIEW

[wrist pa]
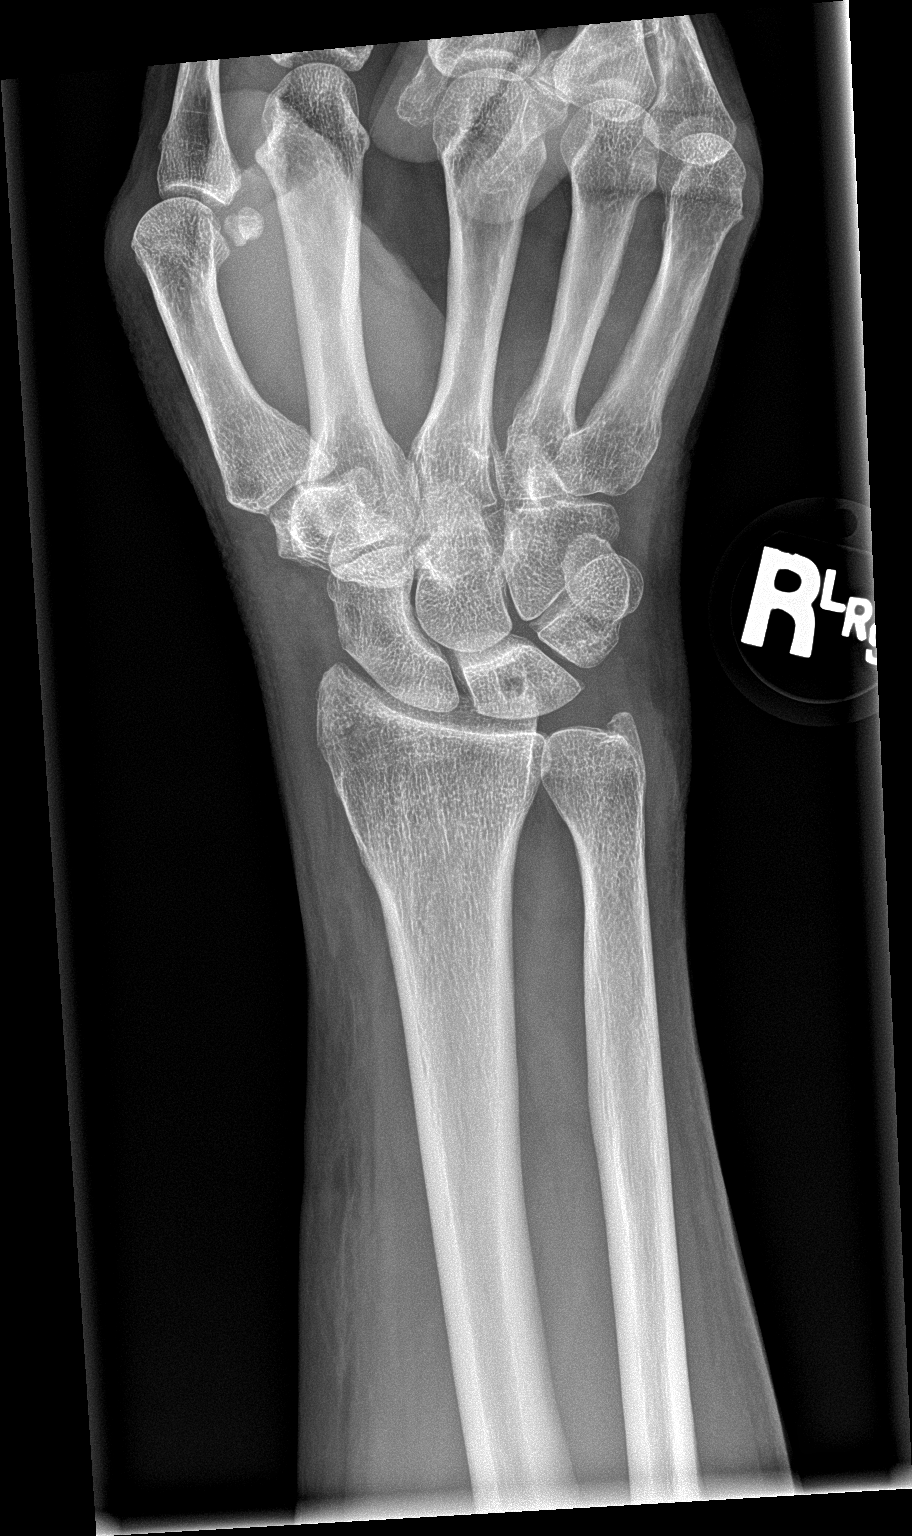

[wrist obl]
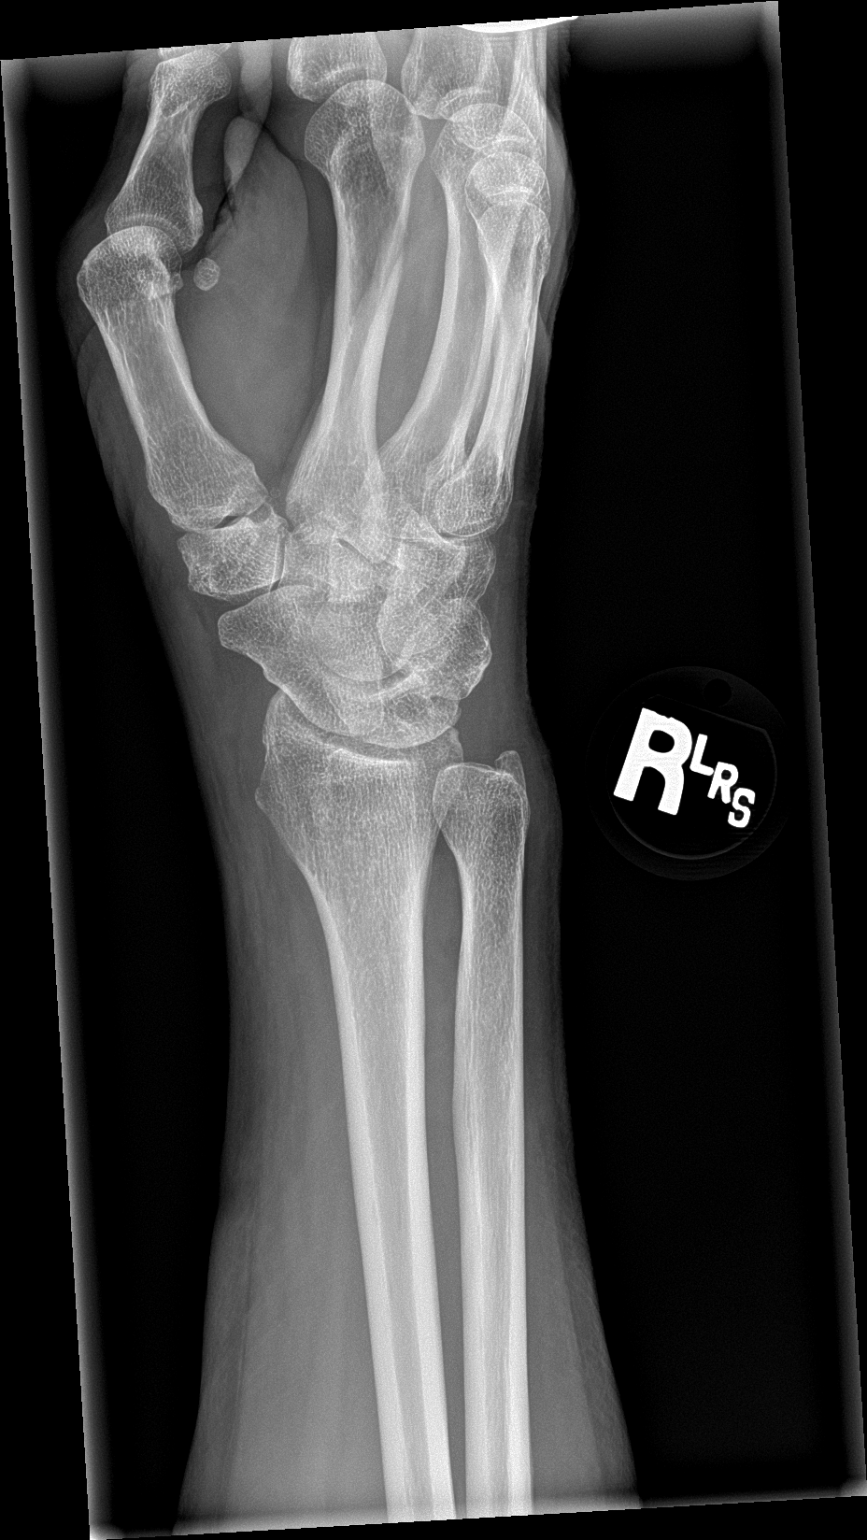

[wrist lat]
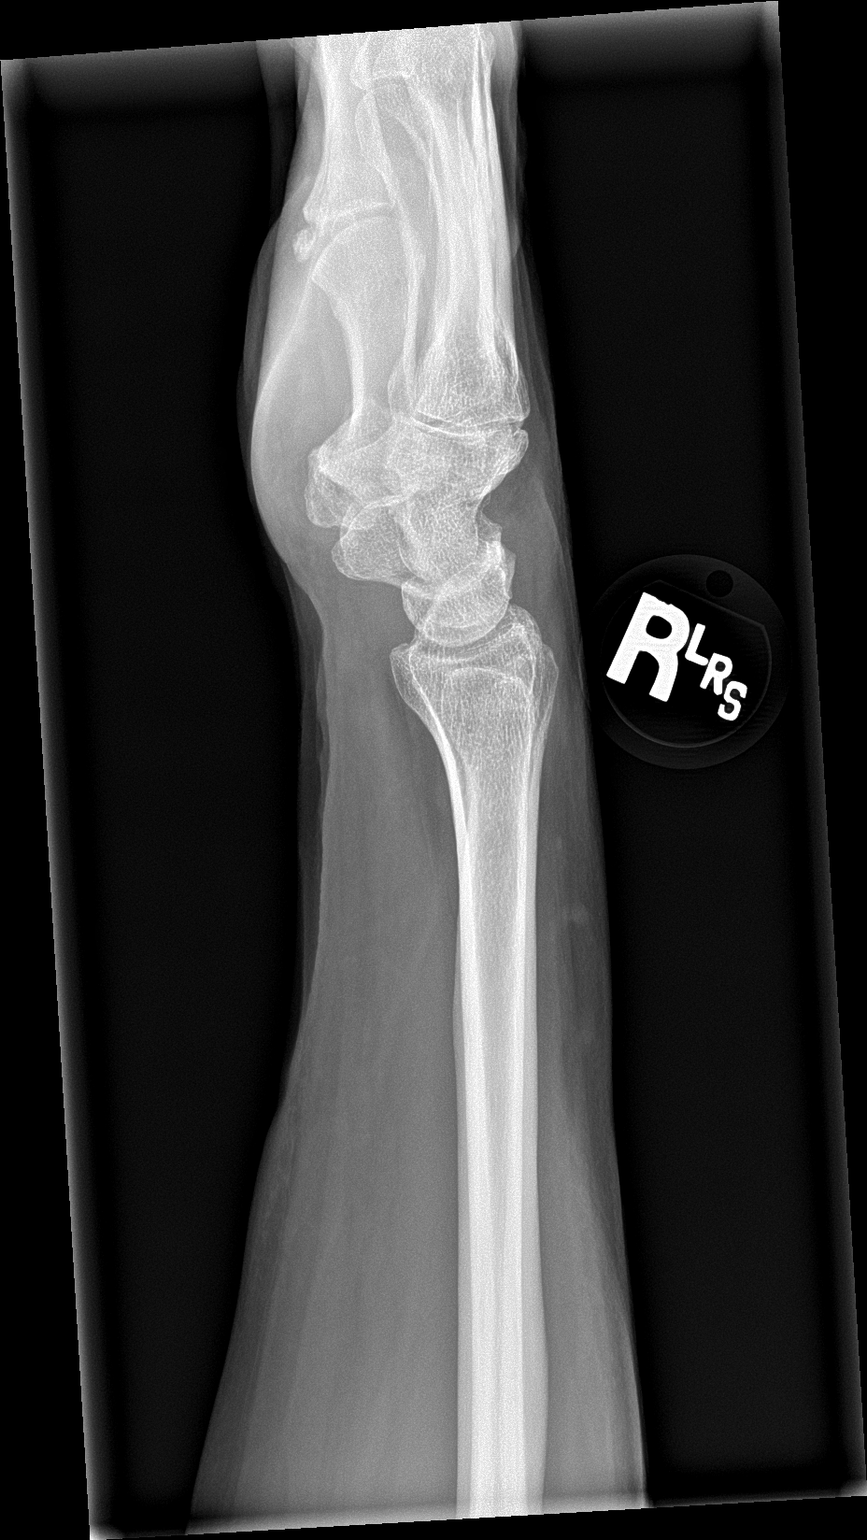

[wrist navicular]
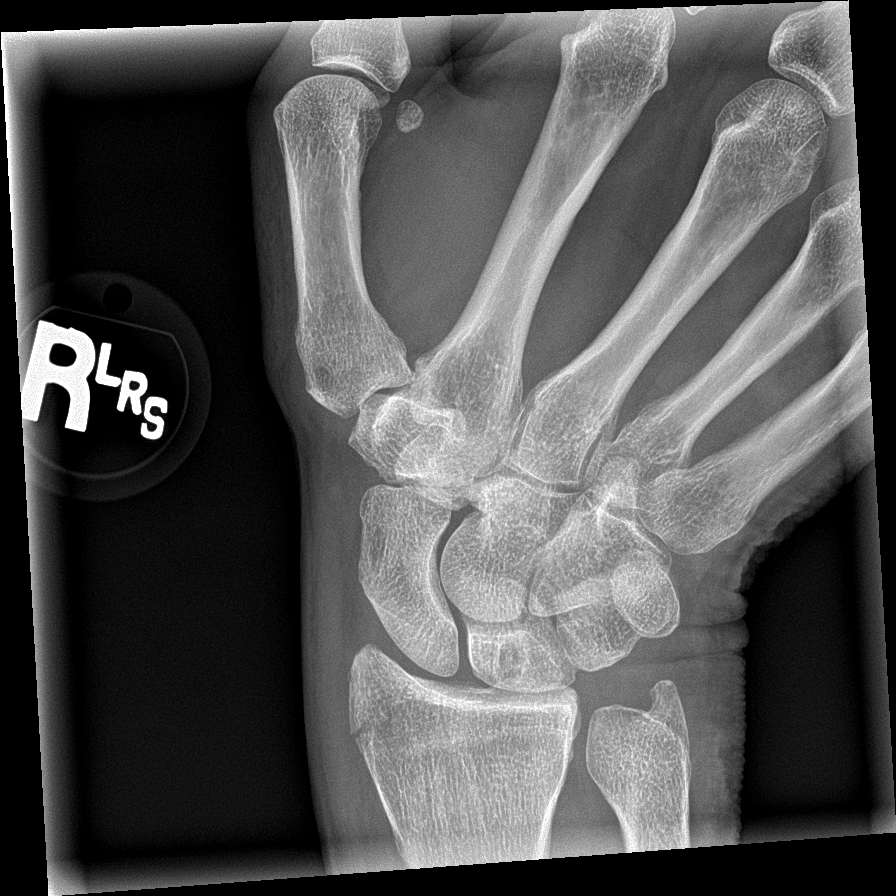

[4 of 4 positions shown; findings below may reference images not displayed]

FINDINGS: Acute nondisplaced fracture involving the styloid process of the
radius, with probable intra-articular component. Positive for soft
tissue swelling. No subluxation.
IMPRESSION: Acute nondisplaced probable intra-articular fracture involving the
radial styloid.

## 2022-01-27 ENCOUNTER — Ambulatory Visit
Admission: RE | Admit: 2022-01-27 | Discharge: 2022-01-27 | Disposition: A | Discharge status: Home / Self Care | Payer: 59 | Source: Ambulatory Visit | Attending: Family Medicine | Admitting: Family Medicine

## 2022-01-27 VITALS — BP 142/112 | HR 76 | Temp 98.1°F | Ht 66.0 in | Wt 115.0 lb

## 2022-01-27 DIAGNOSIS — R590 Localized enlarged lymph nodes: Secondary | ICD-10-CM | POA: Diagnosis not present

## 2022-01-27 DIAGNOSIS — H938X3 Other specified disorders of ear, bilateral: Secondary | ICD-10-CM | POA: Diagnosis not present

## 2022-01-27 DIAGNOSIS — J029 Acute pharyngitis, unspecified: Secondary | ICD-10-CM

## 2022-01-27 LAB — GROUP A STREP BY PCR: Group A Strep by PCR: NOT DETECTED

## 2022-01-27 MED ORDER — AMOXICILLIN 500 MG PO CAPS: 500.0000 mg | ORAL_CAPSULE | Freq: Three times a day (TID) | ORAL | 0 refills | Status: AC

## 2022-01-27 NOTE — ED Triage Notes (Signed)
Pt c/o back of her skull feeling sore, emesis episode on Sunday AM no episodes since, no appetite Sunday or Monday. Also c/o ear fullness bilaterally & sore throat. Has taken otc tylenol day/night w/o relief.

## 2022-01-27 NOTE — Discharge Instructions (Signed)
You were seen today for several issues.  I have swabbed you for strep today, and this will be resulted later today.  We will notify you if positive.  In the mean time I have sent out an antibiotic.  Please take this as directed.  I do recommend you take over the counter motrin as well to help with pain and swelling.  You may also benefit from over the counter zyrtec/claritin to help with any fluid behind the ear drums.  If you are not improving or worsening by next week then please return or see your primary care provider for further evaluation.

## 2022-01-27 NOTE — ED Provider Notes (Signed)
[facility]    CSN: [id number] Arrival date & time: 01/27/22  1154      History   Chief Complaint Chief Complaint  Patient presents with   Ear Fullness    headache, sore back of skull - Entered by patient   Headache   Appointment    1200    HPI Nichole Tucker is a 61 y.o. female.   Patient is here for not feeling well.  Started 5 days ago with a loss of appetite.  She then had vomiting and diarrhea over the weekend, which did resolve. No appetite on Monday.  She did have an extremely sore throat, and then with ear fullness.  She then noted some bumps at the back of her skull that are very tender/sore.  No fevers/chills.  She did take tylenol cold/flu yesterday.  Without much help.  No known sick contacts.        Past Medical History:  Diagnosis Date   Breast cancer ([place])    remission   Menopausal sweats    Thyroid disease     There are no problems to display for this patient.   Past Surgical History:  Procedure Laterality Date   BREAST LUMPECTOMY     ENDOMETRIAL ABLATION     TMJ ARTHROPLASTY     TONSILLECTOMY      OB History   No obstetric history on file.      Home Medications    Prior to Admission medications   Medication Sig Start Date End Date Taking? Authorizing Provider  citalopram (CELEXA) 20 MG tablet Take 20 mg by mouth daily.   Yes [provider]  fluticasone (FLONASE) 50 MCG/ACT nasal spray Place into both nostrils daily.   Yes [provider]  levothyroxine (SYNTHROID, LEVOTHROID) 50 MCG tablet  02/27/11  Yes [provider]  rosuvastatin (CRESTOR) 10 MG tablet TAKE 1 TABLET DAILY 10/05/10  Yes [provider]  albuterol (VENTOLIN HFA) 108 (90 Base) MCG/ACT inhaler Inhale 1-2 puffs into the lungs every 6 (six) hours as needed for wheezing or shortness of breath. 09/09/20   Verda Cumins, MD  azithromycin (ZITHROMAX) 250 MG tablet Take 1 tablet (250 mg total) by mouth daily. Take first 2  tablets together, then 1 every day until finished. 09/09/20   Verda Cumins, MD  HYDROcodone-acetaminophen (NORCO/VICODIN) 5-325 MG tablet Take 1 tablet by mouth every 8 (eight) hours as needed for moderate pain. 02/21/20   Coral Spikes, DO    Family History Family History  Problem Relation Age of Onset   Hypertension Mother    Diabetes Father    CVA Father    Heart disease Father     Social History Social History   Tobacco Use   Smoking status: Former    Types: Cigarettes    Quit date: 12/21/2016    Years since quitting: 5.1   Smokeless tobacco: Never  Vaping Use   Vaping Use: Never used  Substance Use Topics   Alcohol use: Yes    Comment: occasionally   Drug use: Never     Allergies   Patient has no known allergies.   Review of Systems Review of Systems  Constitutional:  Negative for chills and fever.  HENT:  Positive for ear pain and sore throat. Negative for congestion.   Gastrointestinal:  Positive for nausea and vomiting.  Musculoskeletal: Negative.   Neurological:  Positive for headaches.  Psychiatric/Behavioral: Negative.       Physical Exam Triage Vital Signs  ED Triage Vitals  Enc Vitals Group     BP 01/27/22 1216 (!) 142/112     Pulse Rate 01/27/22 1216 76     Resp --      Temp 01/27/22 1216 98.1 F (36.7 C)     Temp Source 01/27/22 1216 Oral     SpO2 01/27/22 1216 100 %     Weight 01/27/22 1213 115 lb (52.2 kg)     Height 01/27/22 1213 '5\' 6"'$  (1.676 m)     Head Circumference --      Peak Flow --      Pain Score 01/27/22 1213 3     Pain Loc --      Pain Edu? --      Excl. in [place]? --    No data found.  Updated Vital Signs BP (!) 142/112 (BP Location: Left Arm)   Pulse 76   Temp 98.1 F (36.7 C) (Oral)   Ht '5\' 6"'$  (1.676 m)   Wt 52.2 kg   SpO2 100%   BMI 18.56 kg/m   Visual Acuity Right Eye Distance:   Left Eye Distance:   Bilateral Distance:    Right Eye Near:   Left Eye Near:    Bilateral Near:     Physical  Exam Constitutional:      Appearance: She is well-developed.  HENT:     Right Ear: A middle ear effusion is present.     Left Ear: A middle ear effusion is present.     Nose:     Right Sinus: No maxillary sinus tenderness or frontal sinus tenderness.     Left Sinus: No maxillary sinus tenderness or frontal sinus tenderness.     Mouth/Throat:     Pharynx: Pharyngeal swelling, posterior oropharyngeal erythema and uvula swelling present. No oropharyngeal exudate.     Tonsils: No tonsillar exudate.  Eyes:     Extraocular Movements: Extraocular movements intact.  Cardiovascular:     Rate and Rhythm: Normal rate and regular rhythm.  Pulmonary:     Effort: Pulmonary effort is normal.     Breath sounds: Normal breath sounds.  Musculoskeletal:     Cervical back: Normal range of motion and neck supple.  Lymphadenopathy:     Head:     Right side of head: Occipital adenopathy present.     Cervical: No cervical adenopathy.     Comments: Very tender to the right occipital area  Neurological:     Mental Status: She is alert.      UC Treatments / Results  Labs (all labs ordered are listed, but only abnormal results are displayed) Labs Reviewed  GROUP A STREP BY PCR    EKG   Radiology No results found.  Procedures Procedures (including critical care time)  Medications Ordered in UC Medications - No data to display  Initial Impression / Assessment and Plan / UC Course  I have reviewed the triage vital signs and the nursing notes.  Pertinent labs & imaging results that were available during my care of the patient were reviewed by me and considered in my medical decision making (see chart for details).   Final Clinical Impressions(s) / UC Diagnoses   Final diagnoses:  Sore throat  Sensation of fullness in both ears  Occipital lymphadenopathy     Discharge Instructions      You were seen today for several issues.  I have swabbed you for strep today, and this will be  resulted later today.  We  will notify you if positive.  In the mean time I have sent out an antibiotic.  Please take this as directed.  I do recommend you take over the counter motrin as well to help with pain and swelling.  You may also benefit from over the counter zyrtec/claritin to help with any fluid behind the ear drums.  If you are not improving or worsening by next week then please return or see your primary care provider for further evaluation.     ED Prescriptions     Medication Sig Dispense Auth. Provider   amoxicillin (AMOXIL) 500 MG capsule Take 1 capsule (500 mg total) by mouth 3 (three) times daily. 21 capsule Rondel Oh, MD      PDMP not reviewed this encounter.   Rondel Oh, MD 01/27/22 1258

## 2022-03-21 ENCOUNTER — Ambulatory Visit
Admission: EM | Admit: 2022-03-21 | Discharge: 2022-03-21 | Disposition: A | Discharge status: Home / Self Care | Payer: 59 | Attending: Emergency Medicine | Admitting: Emergency Medicine

## 2022-03-21 ENCOUNTER — Ambulatory Visit (INDEPENDENT_AMBULATORY_CARE_PROVIDER_SITE_OTHER): Payer: 59

## 2022-03-21 ENCOUNTER — Encounter: Payer: Self-pay | Admitting: Emergency Medicine

## 2022-03-21 DIAGNOSIS — R0781 Pleurodynia: Secondary | ICD-10-CM | POA: Diagnosis not present

## 2022-03-21 MED ORDER — PREDNISONE 20 MG PO TABS: 40.0000 mg | ORAL_TABLET | Freq: Every day | ORAL | 0 refills | Status: AC

## 2022-03-21 MED ORDER — CYCLOBENZAPRINE HCL 10 MG PO TABS: 10.0000 mg | ORAL_TABLET | Freq: Two times a day (BID) | ORAL | 0 refills | Status: AC | PRN

## 2022-03-21 NOTE — Discharge Instructions (Signed)
X-rays negative for injury to the bone  Pain is most likely related injury to the soft tissues and should improve with time  Begin use of prednisone every morning with food for 5 days to help reduce inflammation which in turn will help with your pain, may take Tylenol in addition to this  May take muscle relaxant twice daily as needed for additional comfort, be mindful this medication may make you drowsy  Continue use of compression wrap for stability and support  May use ice or heat over the affected area in 10 to 15-minute intervals  If your pain continues to persist please follow-up with primary doctor for reevaluation and further management

## 2022-03-21 NOTE — ED Triage Notes (Signed)
Pt c/o right sided rib pain. She states one of her dogs rammed into while she was trying groom the dog. She states taking a deep breath, coughing and lying down. She states this occurred about 3 days ago. She states the dog is about 100 pounds.

## 2022-03-21 NOTE — ED Provider Notes (Signed)
[facility]    CSN: [id number] Arrival date & time: 03/21/22  1614      History   Chief Complaint Chief Complaint  Patient presents with   Rib Injury    right    HPI Nichole Tucker is a 62 y.o. female.   Patient presents for evaluation of right-sided rib pain beginning 3 days ago after injury.  Endorses that she was playing with her 100 pound dog when the dog collided into chest.  Has been experience pain with deep breathing, coughing, laughing and all movement.  Has attempted use of Advil and wearing compression without signs of improvement.  Denies shortness of breath, wheeze or cough.  Past Medical History:  Diagnosis Date   Breast cancer ([place])    remission   Menopausal sweats    Thyroid disease     There are no problems to display for this patient.   Past Surgical History:  Procedure Laterality Date   BREAST LUMPECTOMY     ENDOMETRIAL ABLATION     TMJ ARTHROPLASTY     TONSILLECTOMY      OB History   No obstetric history on file.      Home Medications    Prior to Admission medications   Medication Sig Start Date End Date Taking? Authorizing Provider  citalopram (CELEXA) 20 MG tablet Take 20 mg by mouth daily.   Yes [provider]  fluticasone (FLONASE) 50 MCG/ACT nasal spray Place into both nostrils daily.   Yes [provider]  levothyroxine (SYNTHROID, LEVOTHROID) 50 MCG tablet  02/27/11  Yes [provider]  rosuvastatin (CRESTOR) 10 MG tablet TAKE 1 TABLET DAILY 10/05/10  Yes [provider]  amoxicillin (AMOXIL) 500 MG capsule Take 1 capsule (500 mg total) by mouth 3 (three) times daily. 01/27/22   Rondel Oh, MD    Family History Family History  Problem Relation Age of Onset   Hypertension Mother    Diabetes Father    CVA Father    Heart disease Father     Social History Social History   Tobacco Use   Smoking status: Former    Types: Cigarettes    Quit date: 12/21/2016    Years since  quitting: 5.2   Smokeless tobacco: Never  Vaping Use   Vaping Use: Never used  Substance Use Topics   Alcohol use: Yes    Comment: occasionally   Drug use: Yes    Types: Marijuana     Allergies   Patient has no known allergies.   Review of Systems Review of Systems   Physical Exam Triage Vital Signs ED Triage Vitals  Enc Vitals Group     BP 03/21/22 1634 119/79     Pulse Rate 03/21/22 1634 77     Resp 03/21/22 1634 16     Temp 03/21/22 1634 98.5 F (36.9 C)     Temp Source 03/21/22 1634 Oral     SpO2 03/21/22 1634 96 %     Weight 03/21/22 1633 115 lb 1.3 oz (52.2 kg)     Height 03/21/22 1633 '5\' 6"'$  (1.676 m)     Head Circumference --      Peak Flow --      Pain Score 03/21/22 1632 6     Pain Loc --      Pain Edu? --      Excl. in [place]? --    No data found.  Updated Vital Signs BP 119/79 (BP Location: Right Arm)  Pulse 77   Temp 98.5 F (36.9 C) (Oral)   Resp 16   Ht '5\' 6"'$  (1.676 m)   Wt 115 lb 1.3 oz (52.2 kg)   SpO2 96%   BMI 18.57 kg/m   Visual Acuity Right Eye Distance:   Left Eye Distance:   Bilateral Distance:    Right Eye Near:   Left Eye Near:    Bilateral Near:     Physical Exam Constitutional:      Appearance: Normal appearance.  HENT:     Head: Normocephalic.  Eyes:     Extraocular Movements: Extraocular movements intact.  Cardiovascular:     Rate and Rhythm: Normal rate and regular rhythm.     Pulses: Normal pulses.     Heart sounds: Normal heart sounds.  Pulmonary:     Effort: Pulmonary effort is normal.     Breath sounds: Normal breath sounds.  Chest:     Comments: Tenderness is present on the right side of the sternum at approximately ribs 4 through 5 following the right breast line without ecchymosis, swelling or deformity, chest wall is symmetrical Neurological:     Mental Status: She is alert and oriented to person, place, and time. Mental status is at baseline.      UC Treatments / Results  Labs (all labs ordered  are listed, but only abnormal results are displayed) Labs Reviewed - No data to display  EKG   Radiology No results found.  Procedures Procedures (including critical care time)  Medications Ordered in UC Medications - No data to display  Initial Impression / Assessment and Plan / UC Course  I have reviewed the triage vital signs and the nursing notes.  Pertinent labs & imaging results that were available during my care of the patient were reviewed by me and considered in my medical decision making (see chart for details).  Rib pain on right side  X-ray negative, discussed with patient, declined injection in office, prescribed prednisone and Flexeril for outpatient use, recommended continued use of compression, may use ice and heat pillows for support with activity as tolerated, recommended follow-up with PCP if symptoms persist or worsen Final Clinical Impressions(s) / UC Diagnoses   Final diagnoses:  None   Discharge Instructions   None    ED Prescriptions   None    PDMP not reviewed this encounter.   Hans Eden, NP 03/21/22 1721

## 2022-03-22 ENCOUNTER — Ambulatory Visit: Payer: 59
